# Patient Record
Sex: Female | Born: 1950 | Race: White | Hispanic: No | Marital: Single | Smoking: Never smoker
Health system: Southern US, Community
[De-identification: ages and names within clinical notes are randomized; demographics above are authoritative.]

## PROBLEM LIST (undated history)

## (undated) DIAGNOSIS — N289 Disorder of kidney and ureter, unspecified: Secondary | ICD-10-CM

## (undated) HISTORY — PX: ABDOMINAL HYSTERECTOMY: SHX81

---

## 2016-10-16 ENCOUNTER — Emergency Department (HOSPITAL_COMMUNITY): Payer: Medicare Other

## 2016-10-16 ENCOUNTER — Encounter (HOSPITAL_COMMUNITY): Payer: Self-pay | Admitting: *Deleted

## 2016-10-16 ENCOUNTER — Emergency Department (HOSPITAL_COMMUNITY)
Admission: EM | Admit: 2016-10-16 | Discharge: 2016-10-16 | Disposition: A | Discharge status: Home / Self Care | Payer: Medicare Other | Attending: Emergency Medicine | Admitting: Emergency Medicine

## 2016-10-16 DIAGNOSIS — Z79899 Other long term (current) drug therapy: Secondary | ICD-10-CM | POA: Diagnosis not present

## 2016-10-16 DIAGNOSIS — R109 Unspecified abdominal pain: Secondary | ICD-10-CM | POA: Diagnosis present

## 2016-10-16 DIAGNOSIS — R079 Chest pain, unspecified: Secondary | ICD-10-CM | POA: Diagnosis not present

## 2016-10-16 DIAGNOSIS — R935 Abnormal findings on diagnostic imaging of other abdominal regions, including retroperitoneum: Secondary | ICD-10-CM | POA: Diagnosis not present

## 2016-10-16 HISTORY — DX: Disorder of kidney and ureter, unspecified: N28.9

## 2016-10-16 LAB — HEPATIC FUNCTION PANEL
ALT: 12 U/L — ABNORMAL LOW (ref 14–54)
AST: 16 U/L (ref 15–41)
Albumin: 3.7 g/dL (ref 3.5–5.0)
Alkaline Phosphatase: 47 U/L (ref 38–126)
BILIRUBIN DIRECT: 0.1 mg/dL (ref 0.1–0.5)
Indirect Bilirubin: 0.5 mg/dL (ref 0.3–0.9)
Total Bilirubin: 0.6 mg/dL (ref 0.3–1.2)
Total Protein: 6.9 g/dL (ref 6.5–8.1)

## 2016-10-16 LAB — CBC
HEMATOCRIT: 41.1 % (ref 36.0–46.0)
HEMOGLOBIN: 14 g/dL (ref 12.0–15.0)
MCH: 31 pg (ref 26.0–34.0)
MCHC: 34.1 g/dL (ref 30.0–36.0)
MCV: 90.9 fL (ref 78.0–100.0)
Platelets: 277 10*3/uL (ref 150–400)
RBC: 4.52 MIL/uL (ref 3.87–5.11)
RDW: 13.3 % (ref 11.5–15.5)
WBC: 6.3 10*3/uL (ref 4.0–10.5)

## 2016-10-16 LAB — BASIC METABOLIC PANEL
ANION GAP: 10 (ref 5–15)
BUN: 6 mg/dL (ref 6–20)
CHLORIDE: 106 mmol/L (ref 101–111)
CO2: 25 mmol/L (ref 22–32)
Calcium: 9.8 mg/dL (ref 8.9–10.3)
Creatinine, Ser: 0.86 mg/dL (ref 0.44–1.00)
GFR calc Af Amer: >60 mL/min (ref >60)
GLUCOSE: 94 mg/dL (ref 65–99)
POTASSIUM: 4 mmol/L (ref 3.5–5.1)
Sodium: 141 mmol/L (ref 135–145)

## 2016-10-16 LAB — I-STAT TROPONIN, ED: Troponin i, poc: 0 ng/mL (ref 0.00–0.08)

## 2016-10-16 LAB — LIPASE, BLOOD: Lipase: 32 U/L (ref 11–51)

## 2016-10-16 LAB — URINALYSIS, ROUTINE W REFLEX MICROSCOPIC
Bilirubin Urine: NEGATIVE
GLUCOSE, UA: NEGATIVE mg/dL
Hgb urine dipstick: NEGATIVE
Ketones, ur: 5 mg/dL — AB
LEUKOCYTES UA: NEGATIVE
Nitrite: NEGATIVE
PROTEIN: NEGATIVE mg/dL
SPECIFIC GRAVITY, URINE: 1.011 (ref 1.005–1.030)
pH: 6 (ref 5.0–8.0)

## 2016-10-16 NOTE — ED Triage Notes (Signed)
To ED for eval of RLQ pain over the past couple of days- feels same as when she was dx in [place] (06/2016) with enlarged kidney. Since yesterday pt has had a couple of episodes of burning that starts in abd - radiates down to bil groin and bil legs and up to bil arms. Pt denies nauseated when the burning starts but does feel very hot. No sob with episode. Right abd/flank pain is making pt feel nauseated.

## 2016-10-16 NOTE — ED Provider Notes (Signed)
[facility]-EMERGENCY DEPT Provider Note   CSN: [id number] Arrival date & time: 10/16/16  1422     History   Chief Complaint Chief Complaint  Patient presents with  . Abdominal Pain  . Chest Pain    HPI Monique Hartman is a 66 y.o. female.  HPI Patient presents with pain in her mid abdomen and right flank. Has been there since September. States that in [place] of September she diagnosed with some swelling of the right kidney. Reviewing records looks as if she had a hydronephrosis. She also had Lasix scan done that showed 45% activity on the right kidney compared to left of 55%. Had no kidney stones. States since then she's had pain on the right side. States she will have burning episodes. States it starts her right abdomen and goes down both legs and up to her chest. States it feels like when you get the injection for a CAT scan. States is like a tingling. Has not had blood in the urine or burning with urination. No fevers. Has had some nausea.   Past Medical History:  Diagnosis Date  . Renal disorder     There are no active problems to display for this patient.   Past Surgical History:  Procedure Laterality Date  . ABDOMINAL HYSTERECTOMY      OB History    No data available       Home Medications    Prior to Admission medications   Medication Sig Start Date End Date Taking? Authorizing Provider  citalopram (CELEXA) 40 MG tablet Take 20 mg by mouth daily.   Yes Historical Provider, MD  COLLAGEN PO Take 1-3 tablets by mouth daily.   Yes Historical Provider, MD  LUTEIN PO Take 1 capsule by mouth daily.   Yes Historical Provider, MD  promethazine (PHENERGAN) 25 MG tablet Take 25 mg by mouth every 12 (twelve) hours as needed for nausea or vomiting.   Yes Historical Provider, MD  traMADol (ULTRAM) 50 MG tablet Take 25 mg by mouth daily as needed (pain).   Yes Historical Provider, MD  valsartan (DIOVAN) 160 MG tablet Take 80 mg by mouth daily.   Yes Historical Provider, MD     Family History No family history on file.  Social History Social History  Substance Use Topics  . Smoking status: Never Smoker  . Smokeless tobacco: Never Used  . Alcohol use 0.6 oz/week    1 Glasses of wine per week     Allergies   Other and Tofranil [imipramine hcl]   Review of Systems Review of Systems  Constitutional: Negative for appetite change, fatigue and fever.  HENT: Negative for dental problem.   Respiratory: Negative for shortness of breath.   Cardiovascular: Positive for chest pain.  Gastrointestinal: Positive for abdominal pain.  Genitourinary: Positive for flank pain. Negative for difficulty urinating.  Musculoskeletal: Positive for back pain.  Skin: Negative for wound.  Neurological: Negative for weakness.     Physical Exam Updated Vital Signs BP 132/93   Pulse 72   Temp 98.4 F (36.9 C) (Oral)   Resp 14   SpO2 99%   Physical Exam  Constitutional: She appears well-developed.  HENT:  Head: Atraumatic.  Cardiovascular: Normal rate.   Pulmonary/Chest: Effort normal.  Abdominal: There is tenderness.  Tenderness right mid abdomen to right flank. No right subcostal tenderness.  Genitourinary:  Genitourinary Comments: Tenderness in right lower CVA area.  Musculoskeletal: She exhibits no edema.  Neurological: She is alert.  Skin: Skin is warm.  Capillary refill takes less than 2 seconds.  Psychiatric: She has a normal mood and affect.     ED Treatments / Results  Labs (all labs ordered are listed, but only abnormal results are displayed) Labs Reviewed  URINALYSIS, ROUTINE W REFLEX MICROSCOPIC - Abnormal; Notable for the following:       Result Value   Ketones, ur 5 (*)    All other components within normal limits  HEPATIC FUNCTION PANEL - Abnormal; Notable for the following:    ALT 12 (*)    All other components within normal limits  BASIC METABOLIC PANEL  CBC  LIPASE, BLOOD  I-STAT TROPOININ, ED    EKG  EKG  Interpretation None       Radiology Dg Chest 2 View  Result Date: 10/16/2016 CLINICAL DATA:  Right lower abdominal tenderness and substernal pain radiating all over. EXAM: CHEST  2 VIEW COMPARISON:  None. FINDINGS: The heart size and mediastinal contours are within normal limits. Slightly tortuous distal descending thoracic aorta. Both lungs are clear. The visualized skeletal structures are unremarkable. IMPRESSION: No active cardiopulmonary disease. Electronically Signed   By: Tollie Eth M.D.   On: 10/16/2016 17:05   US Renal  Result Date: 10/16/2016 CLINICAL DATA:  Right flank pain with nausea and vomiting EXAM: RENAL / URINARY TRACT ULTRASOUND COMPLETE COMPARISON:  None. FINDINGS: Right Kidney: Length: 11.7 cm. Echogenicity within normal limits. Large cyst lower pole measuring 6.5 by 5.6 x 6.4 cm. Anechoic cystic structure centrally within the kidney measuring 8.3 x 4.3 x 9.2 cm. No solid mass visualized. Left Kidney: Length: 11.1 cm. Echogenicity within normal limits. No mass or hydronephrosis visualized. Bladder: Appears normal for degree of bladder distention. IMPRESSION: 1. No calcifications are identified. No significant right-sided caliceal dilatation. Large cyst lower pole right kidney. Large anechoic cystic structure centrally within the right kidney measuring 8.3 cm, this may represent a large parapelvic cyst or possibly an enlarged extrarenal pelvis. 2. Normal left kidney Electronically Signed   By: Jasmine Pang M.D.   On: 10/16/2016 19:55    Procedures Procedures (including critical care time)  Medications Ordered in ED Medications - No data to display   Initial Impression / Assessment and Plan / ED Course  I have reviewed the triage vital signs and the nursing notes.  Pertinent labs & imaging results that were available during my care of the patient were reviewed by me and considered in my medical decision making (see chart for details).  Clinical Course     Patient with  abdominal pain. History of some hydronephrosis on the right side and right renal cyst. Previous records reviewed. Labs reassuring here. No hydronephrosis now. Will discharge home to have follow-up with primary care doctor. Labs reassuring.  Final Clinical Impressions(s) / ED Diagnoses   Final diagnoses:  Abdominal pain, unspecified abdominal location    New Prescriptions New Prescriptions   No medications on file     Benjiman Core, MD 10/16/16 2103

## 2016-10-16 NOTE — ED Notes (Signed)
Pt reports a "pounding headache," and sts it is probably because she hasn't had coffee today.

## 2018-01-05 IMAGING — US US RENAL
1 series · 14 of 25 positions shown · non-contrast
Comparison: None.

CLINICAL DATA: Right flank pain with nausea and vomiting

EXAM:
RENAL / URINARY TRACT ULTRASOUND COMPLETE

[Series 1: us renal · 0.19mm/px · 14 of 39 slices shown]
[im 1/39]
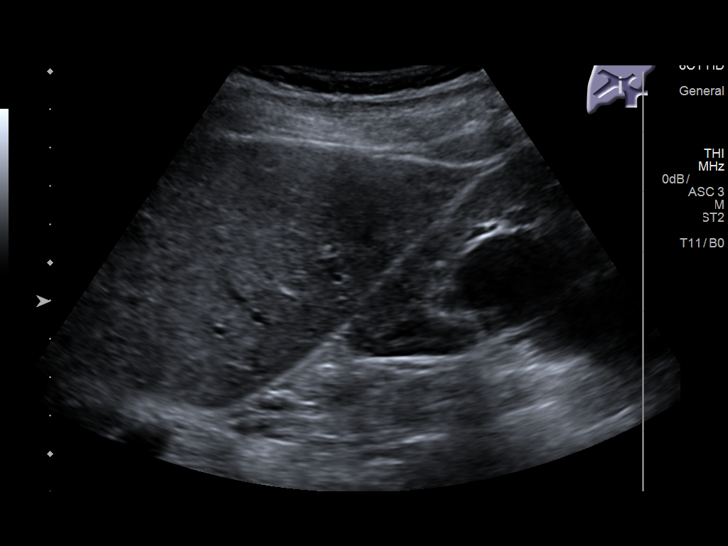
[im 4/39]
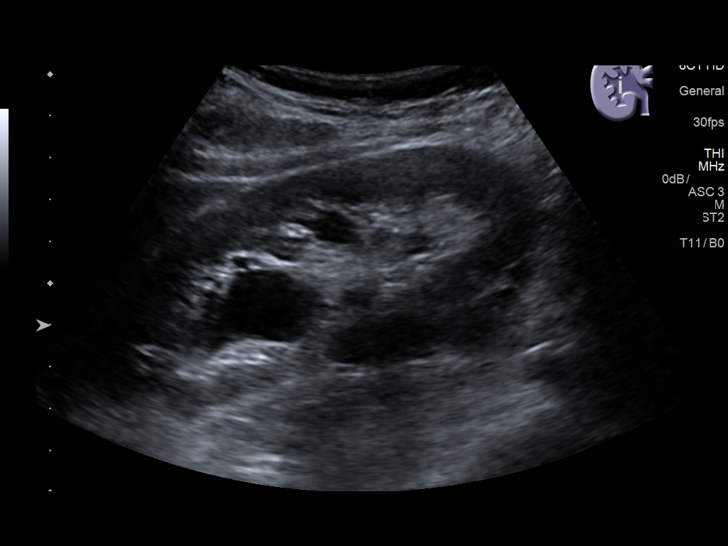
[im 7/39]
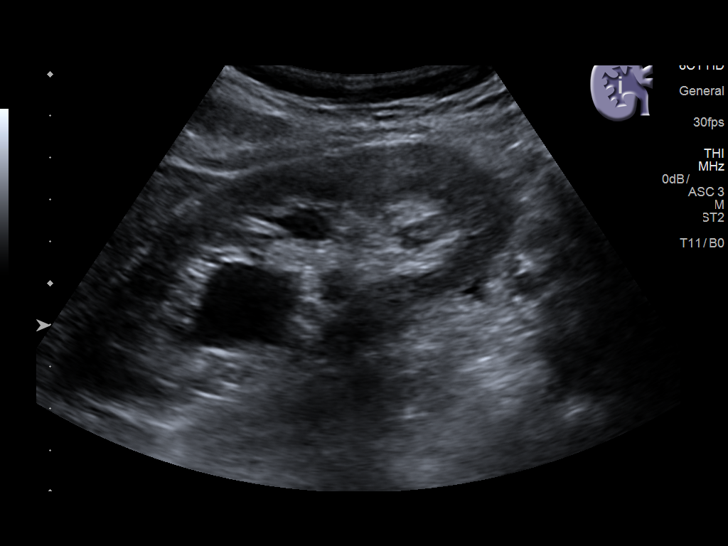
[im 10/39]
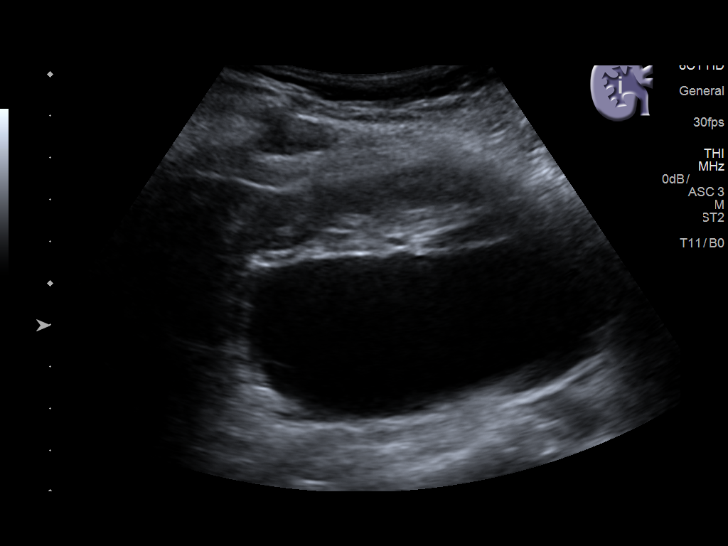
[im 13/39]
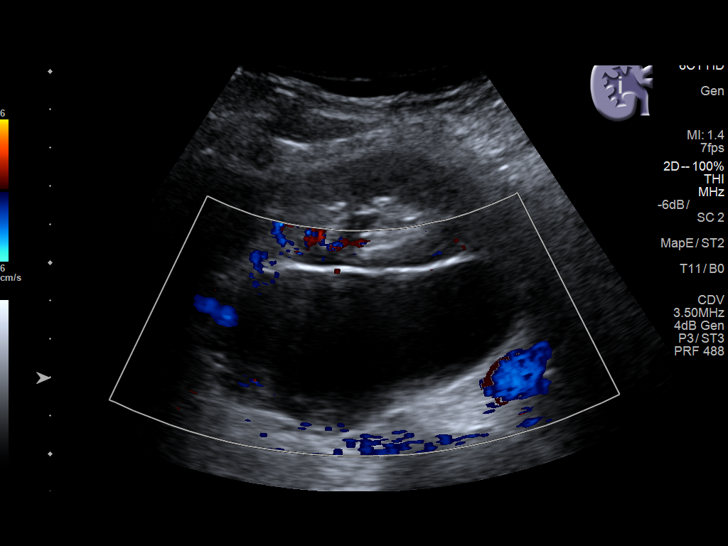
[im 15/39]
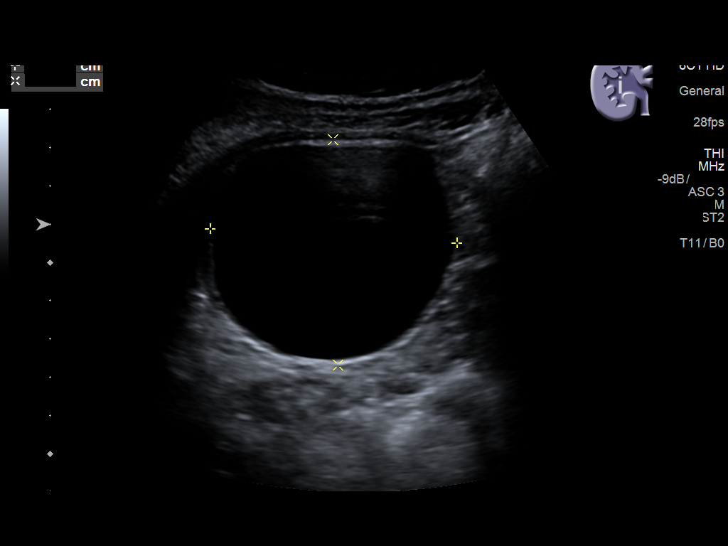
[im 18/39]
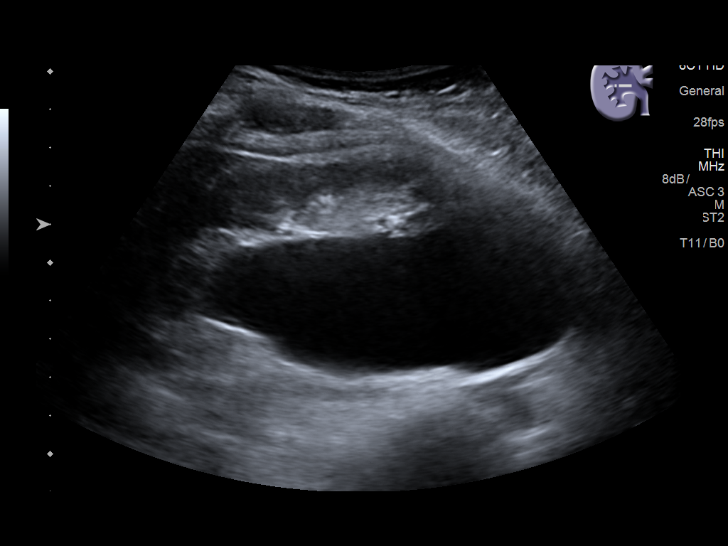
[im 21/39]
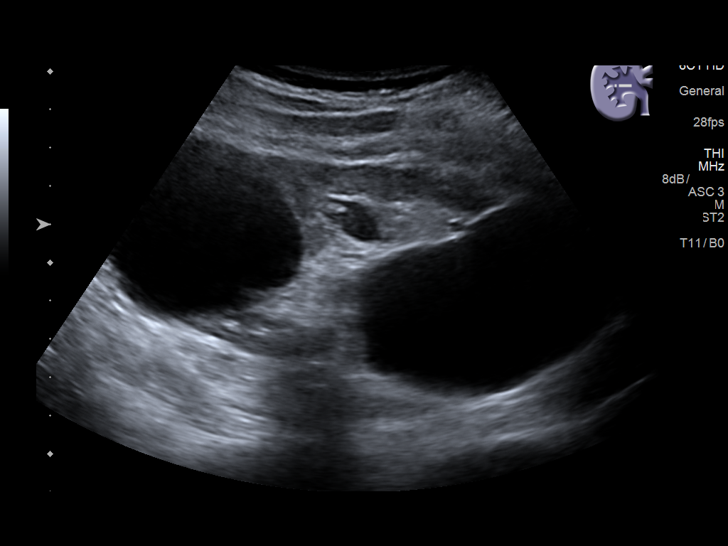
[im 24/39]
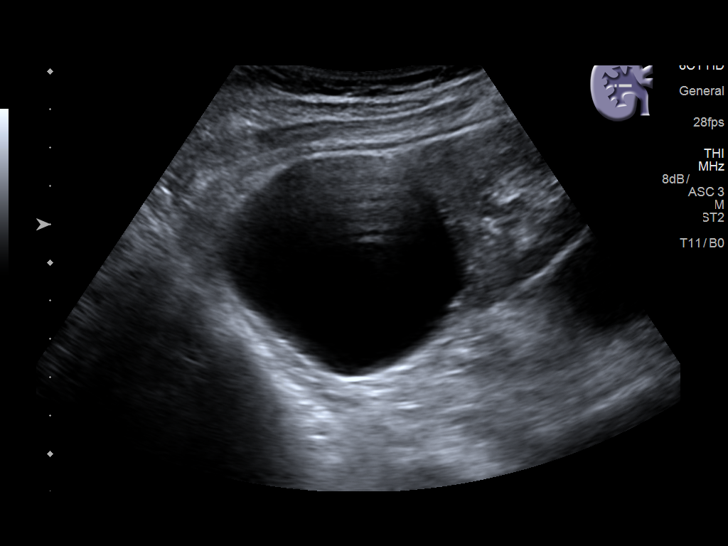
[im 26/39]
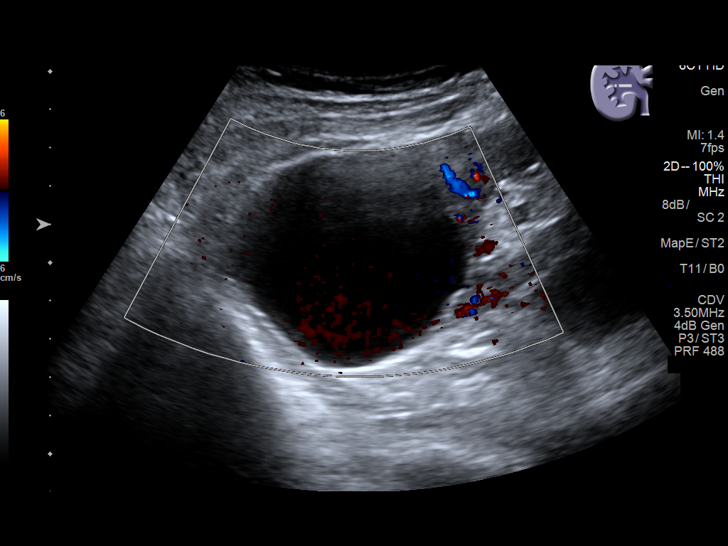
[im 29/39]
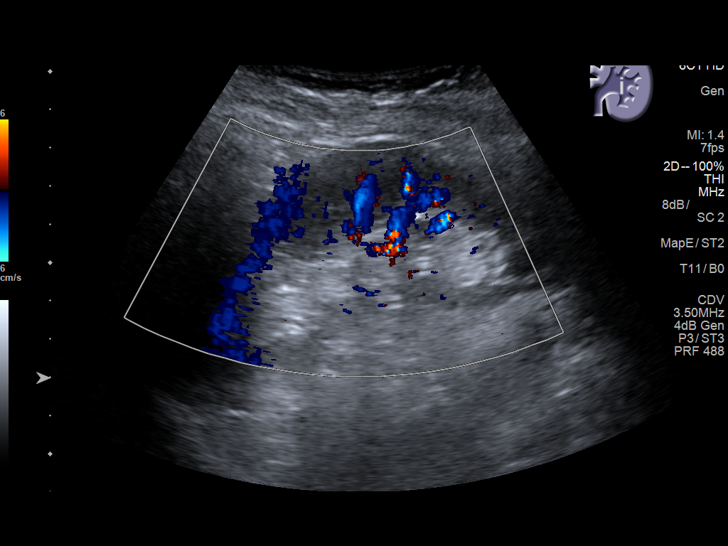
[im 32/39]
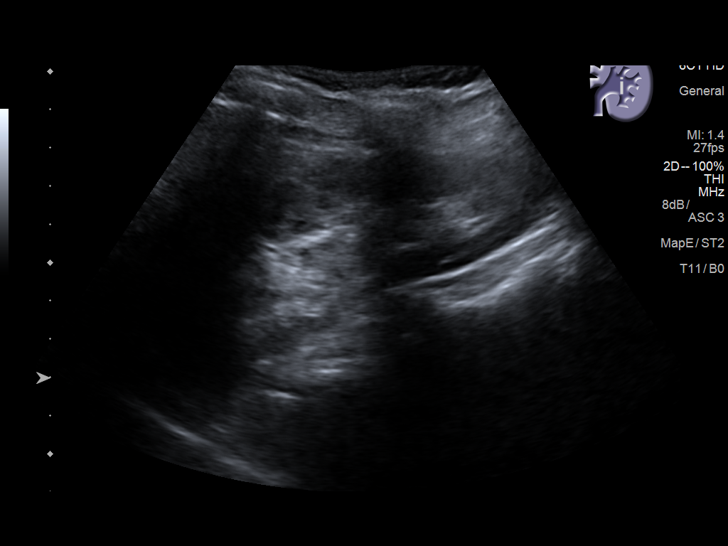
[im 35/39]
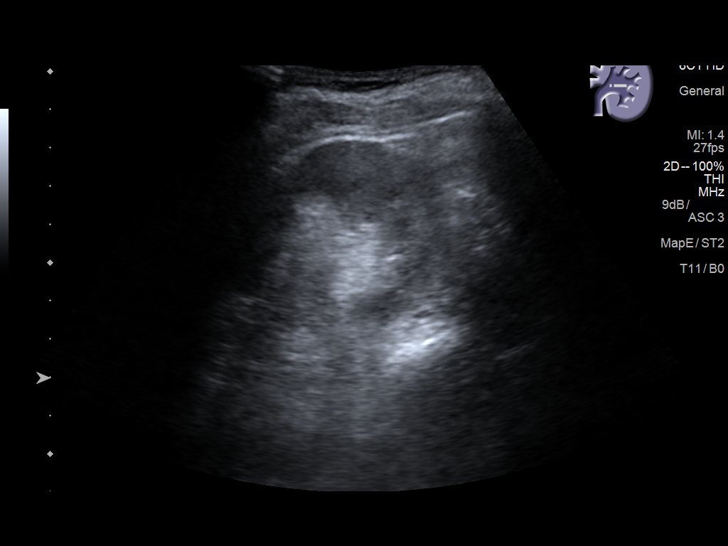
[im 39/39]
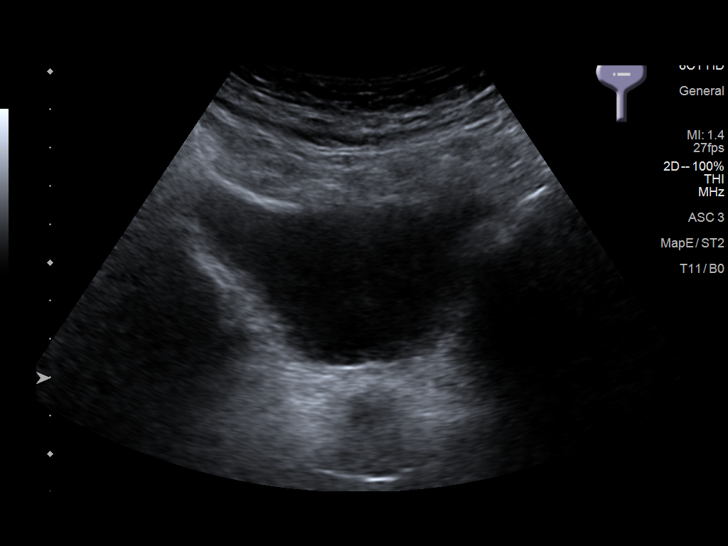

[14 of 25 positions shown; findings below may reference images not displayed]

FINDINGS: Right Kidney:

Length: 11.7 cm. Echogenicity within normal limits. Large cyst lower
pole measuring 6.5 by 5.6 x 6.4 cm. Anechoic cystic structure
centrally within the kidney measuring 8.3 x 4.3 x 9.2 cm. No solid
mass visualized.

Left Kidney:

Length: 11.1 cm. Echogenicity within normal limits. No mass or
hydronephrosis visualized.

Bladder:

Appears normal for degree of bladder distention.
IMPRESSION: 1. No calcifications are identified. No significant right-sided
caliceal dilatation. Large cyst lower pole right kidney. Large
anechoic cystic structure centrally within the right kidney
measuring 8.3 cm, this may represent a large parapelvic cyst or
possibly an enlarged extrarenal pelvis.
2. Normal left kidney
